# Patient Record
Sex: Male | Born: 1935 | Race: White | Hispanic: No
Health system: Southern US, Community
[De-identification: ages and names within clinical notes are randomized; demographics above are authoritative.]

---

## 2005-10-11 ENCOUNTER — Ambulatory Visit: Payer: Self-pay | Admitting: General Practice

## 2005-10-21 ENCOUNTER — Inpatient Hospital Stay: Payer: Self-pay | Admitting: General Practice

## 2006-03-29 ENCOUNTER — Ambulatory Visit: Payer: Self-pay | Admitting: Ophthalmology

## 2007-06-14 IMAGING — CR DG KNEE 1-2V*L*
1 series · 2 of 2 positions shown · non-contrast
Comparison: none

REASON FOR EXAM: Post-op
COMMENTS:  Bedside (portable):Y

[Series 1: view not recorded · 0.17mm/px · 2 of 2 slices shown]
[im 1/2]
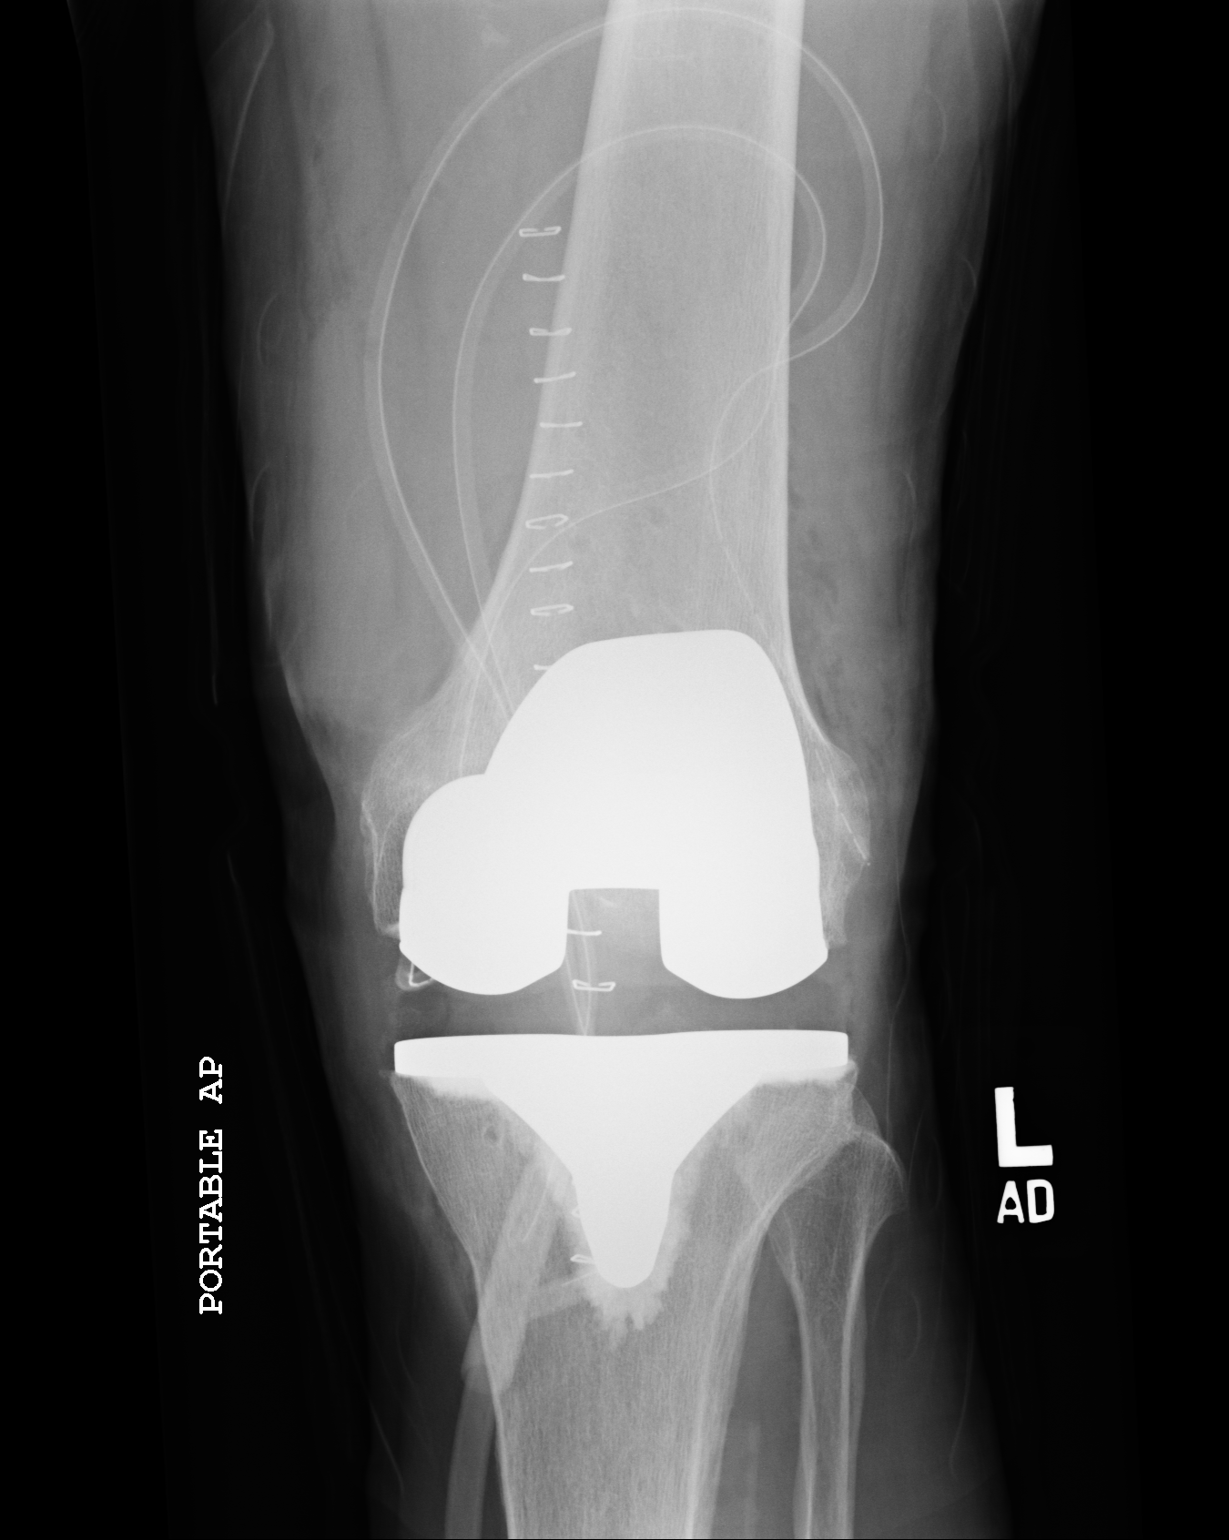
[im 2/2]
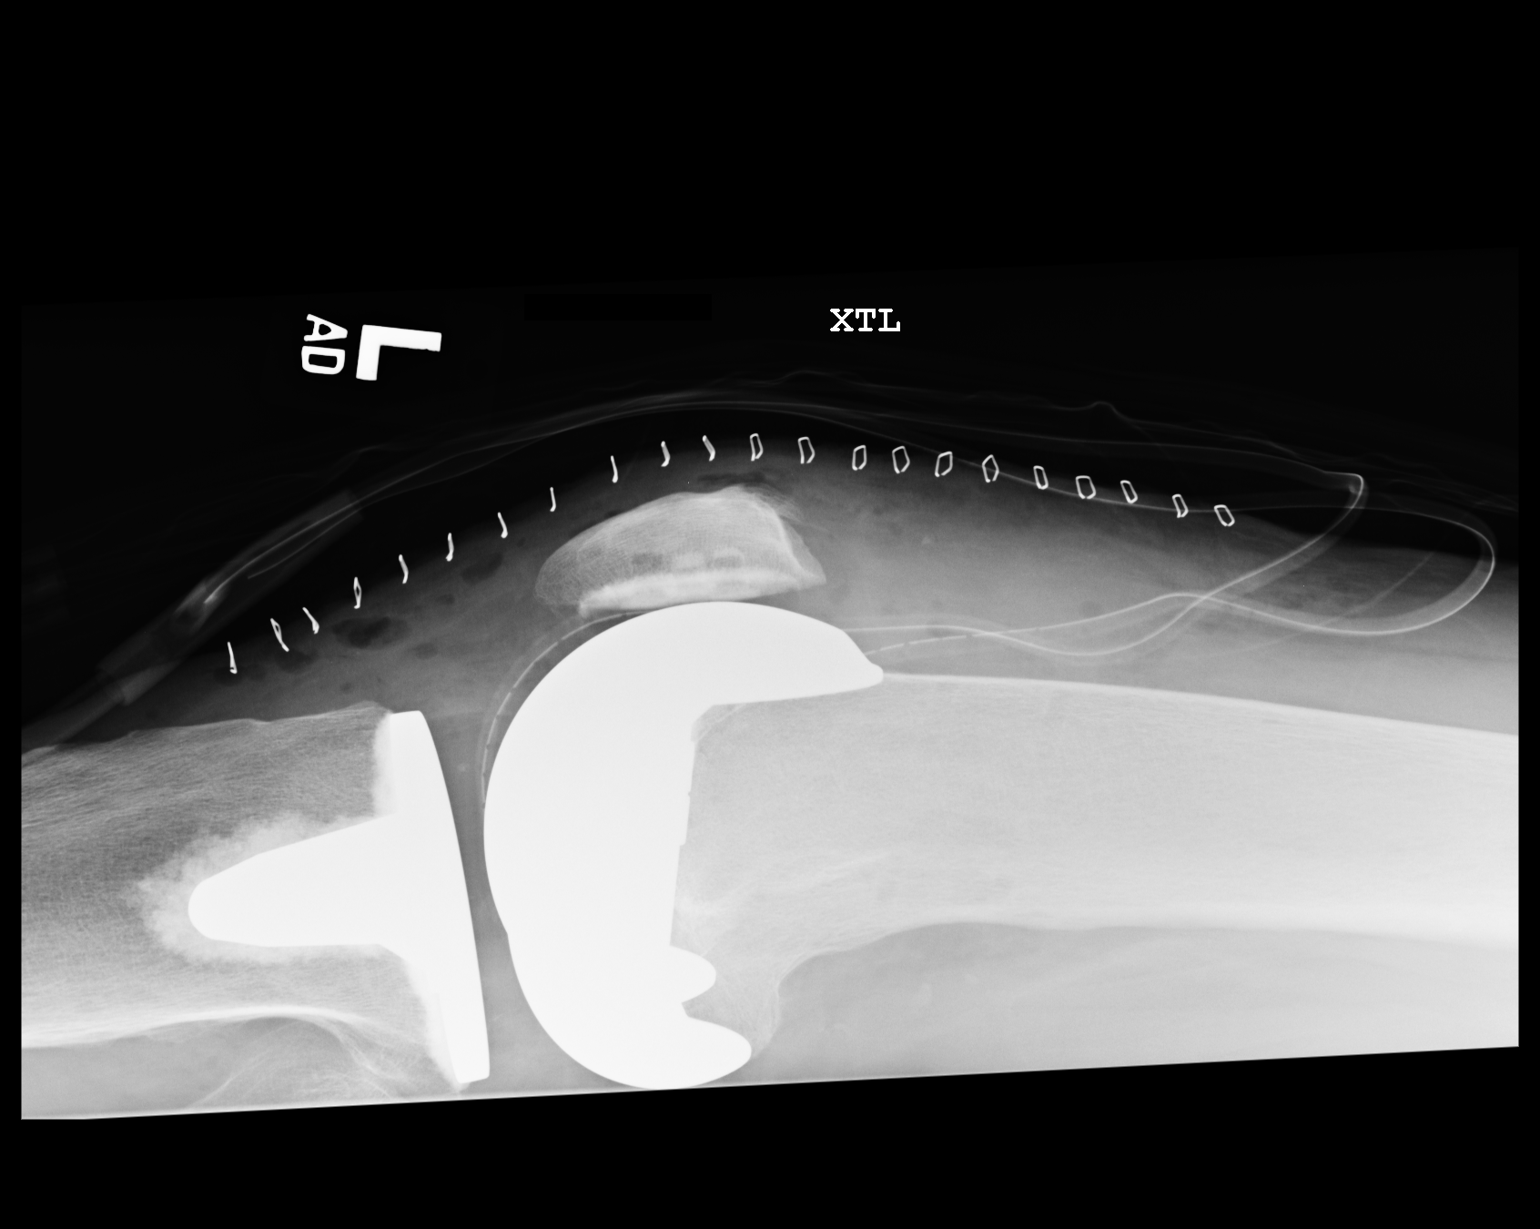

[2 of 2 positions shown; findings below may reference images not displayed]

PROCEDURE:     DXR - DXR KNEE LEFT AP AND LATERAL  - October 21, 2005  [DATE]

RESULT:     AP and lateral views of the LEFT knee show the patient to be
status post LEFT knee replacement. No fracture about the femoral or tibial
prosthetic components is seen. There is no dislocation of the prosthetic
knee joint.
IMPRESSION: The patient is status post LEFT knee replacement. No
abnormal post-operative changes are identified.

## 2012-02-09 ENCOUNTER — Ambulatory Visit: Payer: Self-pay | Admitting: Ophthalmology

## 2013-03-11 ENCOUNTER — Emergency Department: Payer: Self-pay | Admitting: Emergency Medicine

## 2013-04-19 ENCOUNTER — Inpatient Hospital Stay: Payer: Self-pay | Admitting: Family Medicine

## 2013-04-19 LAB — URINALYSIS, COMPLETE
Bilirubin,UR: NEGATIVE
Ketone: NEGATIVE
Nitrite: NEGATIVE
Ph: 5 (ref 4.5–8.0)
Protein: 30
RBC,UR: 5 /HPF (ref 0–5)
Specific Gravity: 1.023 (ref 1.003–1.030)

## 2013-04-19 LAB — COMPREHENSIVE METABOLIC PANEL
Anion Gap: 5 — ABNORMAL LOW (ref 7–16)
Chloride: 104 mmol/L (ref 98–107)
Co2: 27 mmol/L (ref 21–32)
Creatinine: 1.01 mg/dL (ref 0.60–1.30)
EGFR (African American): >60
EGFR (Non-African Amer.): >60
Osmolality: 276 (ref 275–301)
Potassium: 3.9 mmol/L (ref 3.5–5.1)
Sodium: 136 mmol/L (ref 136–145)
Total Protein: 6.2 g/dL — ABNORMAL LOW (ref 6.4–8.2)

## 2013-04-19 LAB — CBC
HCT: 38.8 % — ABNORMAL LOW (ref 40.0–52.0)
Platelet: 146 10*3/uL — ABNORMAL LOW (ref 150–440)
RDW: 13.6 % (ref 11.5–14.5)
WBC: 9 10*3/uL (ref 3.8–10.6)

## 2013-04-19 LAB — PROTIME-INR: Prothrombin Time: 14.9 secs — ABNORMAL HIGH (ref 11.5–14.7)

## 2013-04-19 LAB — LIPASE, BLOOD: Lipase: 98 U/L (ref 73–393)

## 2013-04-19 LAB — CALCIUM: Calcium, Total: 8.6 mg/dL (ref 8.5–10.1)

## 2013-04-21 LAB — BASIC METABOLIC PANEL
BUN: 15 mg/dL (ref 7–18)
Calcium, Total: 8.6 mg/dL (ref 8.5–10.1)
Co2: 27 mmol/L (ref 21–32)
Creatinine: 0.97 mg/dL (ref 0.60–1.30)
EGFR (African American): >60
EGFR (Non-African Amer.): >60
Glucose: 116 mg/dL — ABNORMAL HIGH (ref 65–99)
Osmolality: 272 (ref 275–301)
Sodium: 135 mmol/L — ABNORMAL LOW (ref 136–145)

## 2013-04-23 LAB — PLATELET COUNT: Platelet: 140 10*3/uL — ABNORMAL LOW (ref 150–440)

## 2013-04-25 ENCOUNTER — Inpatient Hospital Stay: Payer: Self-pay | Admitting: Family Medicine

## 2013-04-25 ENCOUNTER — Ambulatory Visit: Payer: Self-pay | Admitting: Hospice and Palliative Medicine

## 2013-04-25 ENCOUNTER — Ambulatory Visit: Payer: Self-pay | Admitting: Internal Medicine

## 2013-04-25 LAB — URINALYSIS, COMPLETE
Glucose,UR: NEGATIVE mg/dL (ref 0–75)
Hyaline Cast: 3
Ketone: NEGATIVE
Ph: 5 (ref 4.5–8.0)
Protein: 30
RBC,UR: 19 /HPF (ref 0–5)
Squamous Epithelial: NONE SEEN

## 2013-04-25 LAB — CK TOTAL AND CKMB (NOT AT ARMC)
CK, Total: 44 U/L (ref 35–232)
CK, Total: 40 U/L (ref 35–232)

## 2013-04-25 LAB — TROPONIN I
Troponin-I: 0.04 ng/mL
Troponin-I: 0.06 ng/mL — ABNORMAL HIGH

## 2013-04-25 LAB — COMPREHENSIVE METABOLIC PANEL
Alkaline Phosphatase: 67 U/L (ref 50–136)
Anion Gap: 3 — ABNORMAL LOW (ref 7–16)
BUN: 34 mg/dL — ABNORMAL HIGH (ref 7–18)
Bilirubin,Total: 0.7 mg/dL (ref 0.2–1.0)
Calcium, Total: 8.9 mg/dL (ref 8.5–10.1)
Creatinine: 1.36 mg/dL — ABNORMAL HIGH (ref 0.60–1.30)
EGFR (African American): 52 — ABNORMAL LOW
Osmolality: 287 (ref 275–301)
Potassium: 4.1 mmol/L (ref 3.5–5.1)
SGOT(AST): 27 U/L (ref 15–37)
SGPT (ALT): 33 U/L (ref 12–78)

## 2013-04-25 LAB — CBC
HCT: 36.5 % — ABNORMAL LOW (ref 40.0–52.0)
MCH: 31.4 pg (ref 26.0–34.0)
MCV: 91 fL (ref 80–100)
RBC: 4.02 10*6/uL — ABNORMAL LOW (ref 4.40–5.90)
RDW: 13.6 % (ref 11.5–14.5)
WBC: 10.9 10*3/uL — ABNORMAL HIGH (ref 3.8–10.6)

## 2013-04-26 LAB — COMPREHENSIVE METABOLIC PANEL
Albumin: 2.2 g/dL — ABNORMAL LOW (ref 3.4–5.0)
Anion Gap: 5 — ABNORMAL LOW (ref 7–16)
BUN: 24 mg/dL — ABNORMAL HIGH (ref 7–18)
Calcium, Total: 8.4 mg/dL — ABNORMAL LOW (ref 8.5–10.1)
Co2: 28 mmol/L (ref 21–32)
EGFR (African American): >60
EGFR (Non-African Amer.): >60
Osmolality: 283 (ref 275–301)
Potassium: 3.9 mmol/L (ref 3.5–5.1)
SGOT(AST): 17 U/L (ref 15–37)
SGPT (ALT): 27 U/L (ref 12–78)
Sodium: 138 mmol/L (ref 136–145)

## 2013-04-26 LAB — CBC WITH DIFFERENTIAL/PLATELET
Basophil #: 0 10*3/uL (ref 0.0–0.1)
HGB: 11.8 g/dL — ABNORMAL LOW (ref 13.0–18.0)
Lymphocyte %: 1.6 %
MCV: 92 fL (ref 80–100)
Monocyte #: 1.2 x10 3/mm — ABNORMAL HIGH (ref 0.2–1.0)
Monocyte %: 8.1 %
Neutrophil %: 89.9 %
Platelet: 142 10*3/uL — ABNORMAL LOW (ref 150–440)
RBC: 3.88 10*6/uL — ABNORMAL LOW (ref 4.40–5.90)
RDW: 13.7 % (ref 11.5–14.5)
WBC: 14.8 10*3/uL — ABNORMAL HIGH (ref 3.8–10.6)

## 2013-04-27 LAB — BASIC METABOLIC PANEL
BUN: 29 mg/dL — ABNORMAL HIGH (ref 7–18)
Calcium, Total: 8.5 mg/dL (ref 8.5–10.1)
Co2: 30 mmol/L (ref 21–32)
Osmolality: 286 (ref 275–301)

## 2013-04-27 LAB — CBC WITH DIFFERENTIAL/PLATELET
Basophil #: 0 10*3/uL (ref 0.0–0.1)
Basophil %: 0.2 %
Eosinophil %: 0 %
HGB: 11.9 g/dL — ABNORMAL LOW (ref 13.0–18.0)
Lymphocyte #: 0.4 10*3/uL — ABNORMAL LOW (ref 1.0–3.6)
MCH: 30.8 pg (ref 26.0–34.0)
MCV: 92 fL (ref 80–100)
Monocyte #: 1.4 x10 3/mm — ABNORMAL HIGH (ref 0.2–1.0)
Neutrophil #: 13.3 10*3/uL — ABNORMAL HIGH (ref 1.4–6.5)
Neutrophil %: 87.6 %
RDW: 14 % (ref 11.5–14.5)
WBC: 15.1 10*3/uL — ABNORMAL HIGH (ref 3.8–10.6)

## 2013-04-27 LAB — MAGNESIUM: Magnesium: 2 mg/dL

## 2013-05-14 ENCOUNTER — Ambulatory Visit: Payer: Self-pay | Admitting: Hospice and Palliative Medicine

## 2013-05-14 DEATH — deceased

## 2014-10-04 NOTE — H&P (Signed)
PATIENT NAME:  Dylan Myers, Dylan Myers MR#:  [id number] DATE OF BIRTH:  Aug 18, 1919  DATE OF ADMISSION:  04/25/2013  PRIMARY CARE PHYSICIAN: Duanne Limerickeanna C. Jones, MD  The patient is a 81 year old Caucasian male with past medical history significant for history of admission for recurrent falls, rhabdomyolysis, and left 7th to 10th rib fractures from the 6th until the 10th of November 2014. He was discharged just 2 days ago to skilled nursing facility, to [facility]. On Monday, the same day when he was discharged, the patient was having some difficulty swallowing, discomfort in  his throat and coughing. The patient's son, however, was able to feed him. The patient was more short of breath as well as poorly responsive the next day, on the 11th of November 2014, and was not able to eat. Now he was brought to the Emergency Room for further evaluation. He was very short of breath and he is on nonrebreather now. He is also noted to have atrial fibrillation with a rate of 160s to 170s, as high as 220s. He is also febrile to temperatures of 101 here in the Emergency Room. He was noted to have some abdominal distention and was not able to void. Foley catheter was placed and more than 1 liter of urine was drained. Hospitalist services were contacted for admission.   PAST MEDICAL HISTORY: Significant for history of recurrent falls as well as rhabdomyolysis  and left 7th to 10th rib fractures, November 6th to 10th admission to [facility], history of dementia, ascending aortic aneurysm of 4.3 cm, history of A. fib, not on anticoagulation, history of hyponatremia, hypomagnesemia during the same admission, history of stroke in the past, also history of hernia, status post hernia repair, history of arthritis, cholecystectomy, appendectomy, left total knee replacement.   ALLERGIES: No known drug allergies.   MEDICATIONS: According to medical records, the patient is on Tylenol 325 mg 2 tablets  rectally every 4 hours as needed as well as p.o., aspirin 81 mg p.o. daily, bisacodyl 10 mg p.o. daily as needed, Cartia 240 mg p.o. daily, Colace 1 mg p.o. twice daily, heparin 5000 units twice daily, lisinopril 2.5 mg p.o. daily, MiraLax 17 grams once daily, ocular lubricant 1 drop to each affected eye every 8 hours as needed, tramadol 50 mg p.o. every 8 hours as needed, Zoloft 25 mg p.o. at bedtime.   SOCIAL HISTORY: Used to live at home by himself. Now he is in rehab since 2 days ago. The patient has a son as well as daughter who take care of him. No smoking. No alcohol abuse.   FAMILY HISTORY: Positive for hypertension.  REVIEW OF SYSTEMS: Not available, as the patient is poorly responsive.   PHYSICAL EXAMINATION:  VITAL SIGNS: On arrival to the hospital, temperature was 101. Respiration rate was 24, pulse 166, blood pressure 129/90. O2  saturation was 97% on 100% nonrebreather.  GENERAL: This is a well-developed, well-nourished Caucasian male in moderate to severe distress. He is short of breath. He is tachypneic.  HEENT: His pupils are equal, reactive to light. Extraocular muscles intact. No icterus or conjunctivitis. I am having difficulty evaluating his hearing, as the patient is poorly responsive. No pharyngeal erythema. Mucosa is dry. The patient is breathing with open mouth breathing.  NECK: No masses. Supple, nontender. Thyroid is not enlarged. No adenopathy. No JVD or carotid bruits bilaterally. Full range of motion.  LUNGS: Clear to auscultation anteriorly. A few rales as well as rhonchi were heard, especially  in lower parts of lungs, as well as diminished breath sounds. The patient has labored inspirations as well as increased effort to breathe. He is moderate to severe respiratory distress.  CARDIOVASCULAR: S1, S2 appreciated. No murmurs, gallops or rubs noted. Rhythm was irregularly irregular, tachycardic. PMI not lateralized. Chest is nontender to palpation.  EXTREMITIES: 1+ pedal  pulses. No lower extremity edema, calf tenderness or cyanosis was noted.  ABDOMEN: Soft, nontender. Bowel sounds were present. No hepatosplenomegaly or masses were noted.  RECTAL: Deferred.  MUSCULOSKELETAL: Muscle strength: Not able to assess, as patient is not cooperative. No cyanosis. Not able to assess him for kyphosis. Gait not tested.  SKIN: Did not reveal any rashes, lesions, erythema, nodularity or induration. It was warm and dry to palpation.  LYMPH: No adenopathy in the cervical region.  NEUROLOGICAL: Cranial nerves grossly intact. Not able to assess sensory or dysarthria. The patient is alert, opens his eyes and moves his eyes around but otherwise not cooperating, otherwise not responding to stimuli and not following commands.  LABORATORY DATA:  BMP showed elevation of glucose to 170. BUN and creatinine were 34 and 1.36, as opposed to 15 and 0.96 on the 8th of November 2014. The patient's liver enzymes were remarkable for albumin level of 2.5. Troponin was 0.04. White blood cell count was elevated to 10.9; hemoglobin was 12.6, platelet count 142. Urinalysis: Yellow hazy urine, negative for glucose, bilirubin or ketones, specific gravity 1.015; pH was 5.0, 2+ blood, 30 mg/dL protein, negative for nitrites, trace leukocyte esterase; 19 red blood cells, 14 white blood cells, no bacteria, no epithelial cells were noted; mucus was present as well as 3 hyaline casts as well as amorphous crystals.   EKG showed A. fib at a rate of 156 beats per minute, left axis deviation, left anterior fascicular block. Nonspecific ST-T changes were noted   RADIOLOGIC STUDIES: Chest x-ray portable single view, 12th of November 2014, revealed  new bibasilar densities consistent with pneumonia, which have appeared since prior study. When patient can tolerate procedure, PA and lateral x-rays would be of value according to radiologist.   ASSESSMENT AND PLAN: 1.  Acute respiratory failure: Admit patient to medical  floor. Get ABGs stat. Will continue oxygen therapy at high rate, keeping pulse oximetries around 92% and above if possible. Will get palliative care involved. The patient does have declaration of desire of natural death, however, remains full code at this time.  2.  Pneumonia, suspected aspiration. However, the patient has been in the hospital recently, so  we will initiate the patient on broad-spectrum antibiotic therapy with vancomycin, Zosyn as well as Zithromax.  3.  Atrial fibrillation with rapid ventricular response: Will continue the patient on Cardizem IV drip.  4.  Renal insufficiency: Will continue IV fluids. Will follow the patient's urine output. The patient's renal insufficiency could have been also related to urinary tract infection as well as urinary retention.  5.  Urinary retention: Will continue Foley catheter.  6.  Dysphagia: Will get speech therapist involved.   TIME SPENT: One hour.    ____________________________ Katharina Caper, MD rv:jcm D: 04/25/2013 13:46:05 ET T: 04/25/2013 14:21:42 ET JOB#: [id number]  cc: Katharina Caper, MD, <Dictator> Duanne Limerick, MD Denyce Harr MD ELECTRONICALLY SIGNED 05/23/2013 14:39

## 2014-10-04 NOTE — H&P (Signed)
PATIENT NAME:  Dylan, Myers MR#:  [id number] DATE OF BIRTH:  26-Jan-1920  DATE OF ADMISSION:  04/19/2013  PRIMARY CARE PHYSICIAN: Duanne Limerick, MD  CHIEF COMPLAINT: Recurrent falls.   HISTORY OF PRESENT ILLNESS: Dylan Myers is a 81 year old Caucasian gentleman who is brought into the Emergency Room by EMS after the patient's daughter found him in the bedroom, leaning against the shelves of a chest of drawers after he could not get up due to his significant weakness. The patient lives at home by himself. He has Lifepath and is followed by a CNA who comes and helps during the daytime, and the patient's daughter and son take turns during the nighttime to take care of him. The patient has been having  mechanical falls, despite using a cane at home. He likely had a fall today several hours ago and lay down on the floor against the chest of drawers in the room. He was found to have significant bruises in his left back and left hip area. Chest x-ray showed no acute abnormality. CT of the head shows chronic changes without acute abnormality. The patient was found to have elevated CPK and is being admitted for acute rhabdomyolysis with frequent falls, failure to thrive and nondisplaced rib fractures on the left status post fall.   PAST MEDICAL HISTORY: 1.  Hypertension.  2.  Early dementia.  3.  Hernia.  4.  History of A. fib.  5.  Arthritis.  6.  Prostatectomy.   7.  Hernia repair surgery.  8.  Appendectomy.  9.  Left total knee replacement.   ALLERGIES: No known drug allergies.   MEDICATIONS: 1.  Aspirin 81 mg daily.  2.  Cartia XT 240 p.o. daily.  3.  Lisinopril 2.5 mg p.o. daily.  4.  Zoloft 25 mg p.o. at bedtime.   SOCIAL HISTORY: The patient lives at home by himself. He has the following arrangement as above. His son and daughter take care of him during the nighttime, and daytime he has CNA who visits and spends several hours at home with him. No smoking. No alcohol.   FAMILY  HISTORY: Positive for hypertension.   REVIEW OF SYSTEMS:    CONSTITUTIONAL: No fever. Positive for fatigue, weakness and left back pain.  EYES: No blurred or double vision or glaucoma or cataracts.  EARS, NOSE, THROAT: No tinnitus, ear pain, hearing loss or postnasal drip.  RESPIRATORY: No cough, wheeze, hemoptysis or COPD.  CARDIOVASCULAR: No chest pain, orthopnea, edema, arrhythmia or dyspnea on exertion.  GASTROINTESTINAL: No nausea, vomiting, diarrhea, abdominal pain or hematemesis. Positive for GERD.  GENITOURINARY: No dysuria, hematuria or renal calculus.  ENDOCRINE: No polyuria, nocturia or thyroid problems.  HEMATOLOGIC: No anemia, easy bruising or bleeding.  SKIN: No acne or rash. Positive for bruises over the left back.  MUSCULOSKELETAL: Positive for arthritis and left knee pain.  NEUROLOGIC: No CVA, TIA, anxiety. Positive for dementia. PSYCHIATRIC: No anxiety or depression. No insomnia.   All other systems reviewed and negative.   PHYSICAL EXAM:  GENERAL: The patient is awake, alert, oriented x 3, not in acute distress.  VITAL SIGNS: Afebrile. Pulse is 125, respirations 19. Blood pressure is 118/76. Sats are 96% on 2 liters.  HEENT: Atraumatic, normocephalic. Pupils: PERRLA. EOM intact. Oral mucosa is moist.  NECK: Supple. No JVD. No carotid bruit.  RESPIRATORY: Clear to auscultation bilaterally. No rales, rhonchi, respiratory distress or labored breathing.  CARDIOVASCULAR: Both the heart sounds are normal.  Rate is tachycardic. Rhythm is  regular. No murmur heard. PMI not lateralized. Chest nontender.  EXTREMITIES: Good pedal pulses, good femoral pulses. No lower extremity edema.  ABDOMEN: Soft, benign, nontender. No organomegaly. There is some tenderness present in the left flank and back because of the rib fractures. No organomegaly. Bowel sounds are positive.  NEUROLOGIC: Grossly intact cranial nerves II through XII. No motor or sensory deficit.  PSYCHIATRIC: The patient is  awake, alert, oriented x 3.  SKIN: Warm and dry.   CT of the chest shows nondisplaced left lateral 7th through 10th rib fractures present. No pneumothorax. Central airways are patent. No evidence of aspiration. There is ankylosis of the portions of the thoracic spine with ossification of the disk spaces. This is most compatible with DISH. Ascending aortic aneurysm measuring 43 mm. Coronary atherosclerosis present. No pleural or pericardial effusion.The gallbladder is partially collapsed. No solid or hollow abdominal visceral injury.   CT of the head is chronic changes without acute abnormality.   Calcium is 8.6. CK total is 1893; MB is 13.1. Glucose is 119; BUN is 20; creatinine is 1.01; sodium is 136. SGOT is 85; SGPT is 33; albumin is 3.3. Hemoglobin and hematocrit are 13.6 and 38.8; platelet count is 146; white count is 9.0. Lipase is 98. Magnesium is 1.7. PT-INR is 14.9 and 1.2.   ASSESSMENT AND PLAN: A 81 year old Dylan Myers with history of chronic atrial fibrillation, hypertension, early dementia, comes in after he was found down on the floor leaning against a chest of drawers  in the bedroom for several hours. He was found to have:  1.  Acute rhabdomyolysis. The patient is going to be admitted to medical floor. Will start patient with aggressive hydration, follow his I's and O's, follow up metabolic panel and CPK.  2.  Nondisplaced left 7th to 10th rib fractures status post fall at home. No evidence of pneumothorax. P.r.n. pain meds with tramadol and Tylenol. Incentive spirometry will be given to patient.  3.  Relative hypotension in a patient with history of hypertension. Will give IV fluids, hydrate patient well,  and then resume BP medications when blood pressure is stable.  4.  Early dementia. Continue Zoloft.  5.  Recurrent falls with mechanical falls at home, accidental. Will have PT evaluate patient.  6.  Discharge planning. The patient's son, who is the healthcare power of  attorney, along with the daughter would like to discuss with social work/care management for discharge planning, [facility] with the family at this time.  7.  Deep vein thrombosis prophylaxis: Subcutaneous heparin.  8.  Incidental finding of ascending abdominal aortic aneurysm, which is 43 mm. Will consider curb siding vascular in the morning. Will discuss with patient's son and make him aware about the AAA that was found on the CT of the chest.  9.  Further work-up according to the patient's clinical course. Hospital admission plan was discussed with the patient and the patient's son. Son is the healthcare power of attorney. The patient is a no code/DNR per son. Will respect his wishes and put the orders for DNR.  TIME SPENT: 50 minutes.   ____________________________ Wylie HailSona A. Allena KatzPatel, MD sap:jcm D: 04/19/2013 18:18:04 ET T: 04/19/2013 19:24:20 ET JOB#: [id number]  cc: Syrenity Klepacki A. Allena KatzPatel, MD, <Dictator> Duanne Limerickeanna C. Jones, MD Willow OraSONA A Brandis Matsuura MD ELECTRONICALLY SIGNED 04/20/2013 14:26

## 2014-10-04 NOTE — Discharge Summary (Signed)
PATIENT NAME:  Virl SonLBRIGHT, Jandel F MR#:  [id number] DATE OF BIRTH:  Nov 06, 1919  DATE OF ADMISSION:  04/19/2013 DATE OF DISCHARGE:  04/23/2013  ADMISSION DIAGNOSIS: Recurrent falls.  DISCHARGE DIAGNOSES: 1.  Recurrent falls. 2.  Acute rhabdomyolysis.  3.  Nondisplaced left 7 to 10 rib fractures.  4.  Relative hypotension, resolved with fluids.  5.  Early dementia.  6.  Ascending aortic abdominal aneurysm, 4.3 cm 7.  DO NOT RESUSCITATE.  8.  History of atrial fibrillation, rate controlled. Not on anticoagulation.  9.  Mild hyponatremia. 10.  Mild hypomagnesemia.  11.  Previous cerebrovascular accident.   IMPORTANT RESULTS: Glucose 119, BUN 20, creatinine 1.01. Magnesium 1.7. Total protein 6.2, albumin 3.2. Total CK on admission 1800, down to 400 now. Platelets 146, hemoglobin 13.6, white blood count 9. Urinalysis overall negative for infection.  CT of the head without contrast: Chronic changes without any acute abnormality. Mostly old posterior temporal infarct. No acute masses.  CT of the chest, as mentioned above, 7 to 10 nondisplaced rib fractures and 4.3 cm ascending aortic aneurysm, stable.   DISCHARGE MEDICATIONS: 1.  Lisinopril 2.5 mg daily. 2.  Cartia 240 mg once a day, XL formulation. 3.  Aspirin 81 mg daily. 4.  Zoloft 25 mg daily. 5.  Tylenol 2 tablets every 4 hours p.r.n.  6.  Tramadol 50 mg every 8 hours.  7.  Heparin 500 units subcutaneously every 12 hours.  8.  Ocular lubricant.  9.  MiraLax 17 grams once a day.  10.  Dulcolax suppository as needed for constipation.  11.  Colace twice daily.   DISCHARGE FOLLOWUP: With Dr. Elizabeth Sauereanna Jones in the next 1 to 2 weeks.   HOSPITAL COURSE: A 81 year old gentleman with history of early dementia, comes with recurrent falls, on 04/19/2013, brought by EMS as his daughter found him down in the bathroom leaning against a shelf of a chest of drawers. After he was unable to get up due to significant weakness, the daughter decided to  come to the ER via EMS. He has Lifepath during the daytime, but at nighttime his son and daughter take care of him. The patient had some bruises on the chest for what a CT of the chest was done. The CT of the chest showed significant fracture, 7 to 10, nondisplaced, on the left side of the ribs. He had elevation of CPK, mild rhabdomyolysis, for which the patient was admitted and evaluated.  1.  Rhabdomyolysis. The patient was given IV fluids. There were no signs of precipitation of myoglobin. No acute kidney injury. The patient had mild rhabdomyolysis, resolved with IV fluids.  2.  Rib fractures. No pneumothorax. Nondisplaced, The patient only needs pain medications and incentive spirometry.  3.  Relative hypotension. It was secondary to dehydration and intravascular volume depletion and it resolved with fluids as well. The patient has early dementia for what he has been taking Zoloft. He is [age] and he is in a very good state of health for his age.  4.  As far as hypertension, he will continue lisinopril 2.5 mg once daily. He has a triple A for what he will need a CT of the abdomen every year to evaluate for increase of size. At this moment, since the patient is [age], the family decided not to do this and just monitor clinically. The patient would not be a good candidate for surgery at this moment even if it was more than 5 mm. The patient did well during this  hospitalization.  I spent about 45 minutes discharging this patient today. ____________________________ Felipa Furnace, MD rsg:sb D: 04/23/2013 12:52:41 ET T: 04/23/2013 13:09:39 ET JOB#: [id number]  cc: Felipa Furnace, MD, <Dictator> Duanne Limerick, MD Pearletha Furl MD ELECTRONICALLY SIGNED 05/02/2013 23:01

## 2014-10-04 NOTE — Discharge Summary (Signed)
PATIENT NAME:  Dylan Myers, Dylan Myers MR#:  [id number] DATE OF BIRTH:  1920-04-11  DATE OF ADMISSION:  04/25/2013 DATE OF DISCHARGE:  12-08-12  REASON FOR ADMISSION: Difficulty swallowing,  difficulty breathing and altered mental status.   DISPOSITION:  Hospice home.   MEDICATIONS AT DISCHARGE:  Morphine 20 mg p.o. as needed for pain, atropine drops on the eye every hour for secretions, lorazepam 1 mg tablet as needed for anxiety, oxygen for comfort only.    HOSPITAL COURSE:  Dylan Myers is a very nice 81 year old gentleman, who has history of dementia. He was admitted to the hospital previously on November 6 to November 10.  The patient had a significant history of falls that were recurrent, complicated with rhabdomyolysis. One of his falls rib fractures 7 to 10, on left side. The patient was discharged to [facility] and apparently was having some difficulty swallowing and some coughing. The family was able to feed him, but he started becoming poorly responsive and short of breath, the following day. He was not able to eat, for what he was brought to the Emergency Department. In the Emergency Department, he was found to be on atrial fibrillation with rapid ventricular response up to 220. He had a temperature of 101F  and the patient has bilateral consolidations on both lower lungs and he had some urinary retention. After the Foley was placed, the patient drained over 1 liter of urine. The patient was  tachypneic, tachycardic with a high temperature and he was on 100% nonrebreather at 97%. There is no documented oxygen saturation without the nonrebreather. The patient looked severely ill, septic looking and with acute respiratory failure. His CO2 went up to 89% and that was with supplemental oxygen. He was in severe respiratory distress with use of accessory muscles.  The patient was very ill and he was admitted through the Emergency Department.  He was already a DO NOT RESUSCITATE and DO  NOT INTUBATE, but he was put on a Cardizem drip to reverse his atrial fibrillation with RVR.  As far as the diagnosis, the patient was diagnosed with pneumonia for which he was started on Zosyn and Zithromax and Levaquin was started the following day instead of Zithromax to cover for  health care acquired pneumonia. He was also started on vancomycin. He was not really responding very well to treatment. He was severely obtunded,  unresponsive for what the family had made the decision of sending him to hospice care and withdraw any treatments.  His atrial fibrillation responded partially the Cardizem drip, but this was pretty much triggered by sepsis. The patient was tachycardic, tachypneic with systemic inflammatory response syndrome secondary to pneumonia. For his urinary retention,  the patient was left on the Foley catheter and discharged with the Foley catheter. Other medical problems were stable. The patient had is wishes made up in the past and the family was well aware of them. Long discussions with the family were done during this hospitalization. I spent about 95 minutes with this patient and his family on the discharge day, 12-08-12.   ____________________________ Felipa Furnaceoberto Sanchez Gutierrez, MD rsg:cc D: 04/30/2013 22:22:57 ET T: 04/30/2013 23:18:28 ET JOB#: [id number]  cc: Felipa Furnaceoberto Sanchez Gutierrez, MD, <Dictator> Zorianna Taliaferro Juanda ChanceSANCHEZ GUTIERRE MD ELECTRONICALLY SIGNED 05/02/2013 23:12

## 2014-12-11 IMAGING — CT CT HEAD WITHOUT CONTRAST
2 series · 14 of 30 positions shown, 16 images · non-contrast
Comparison: 03/11/2013

CLINICAL DATA: Unresponsive

EXAM:
CT HEAD WITHOUT CONTRAST
TECHNIQUE: Contiguous axial images were obtained from the base of the skull
through the vertex without intravenous contrast.

[Series 2: head wo · axial · 0.43mm/px · z∈[+1262,+1367]mm · 6 of 31 slices shown, 8 images]
[im 5/31  brain]
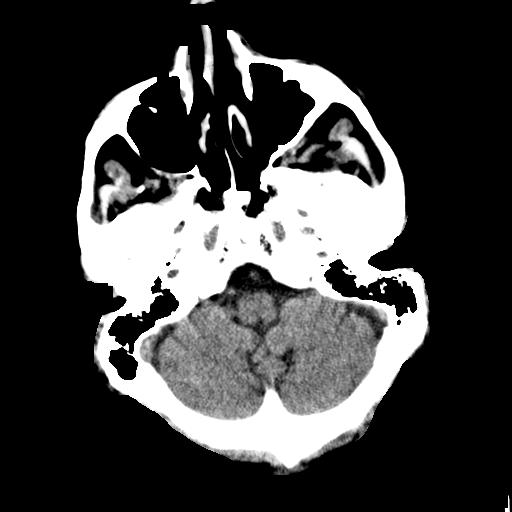
[im 5/31  bone]
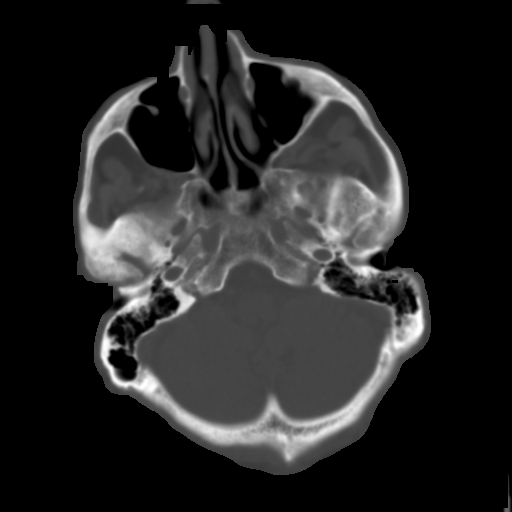
[im 9/31  brain]
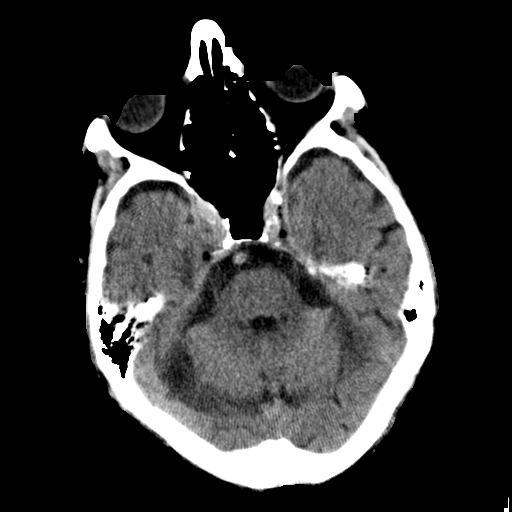
[im 13/31  brain]
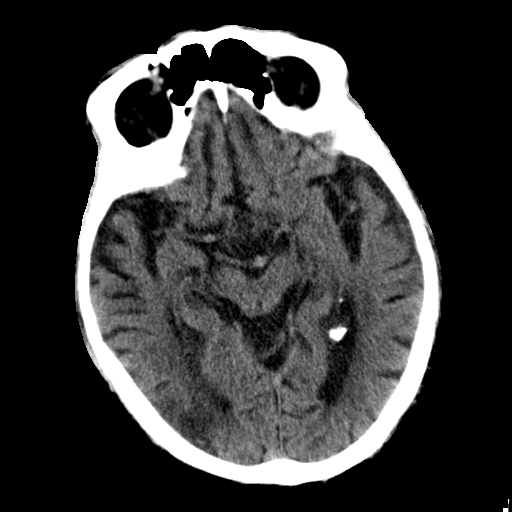
[im 18/31  brain]
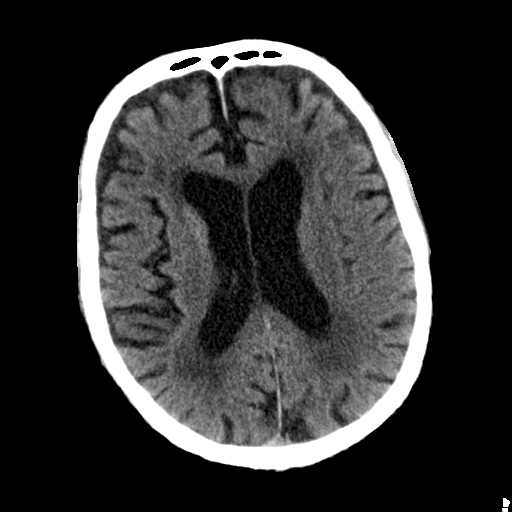
[im 22/31  brain]
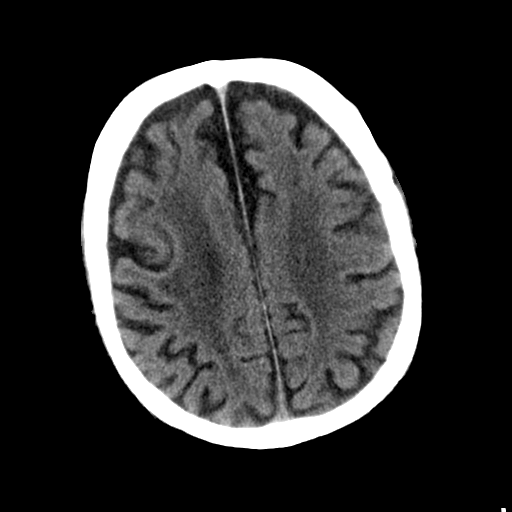
[im 22/31  bone]
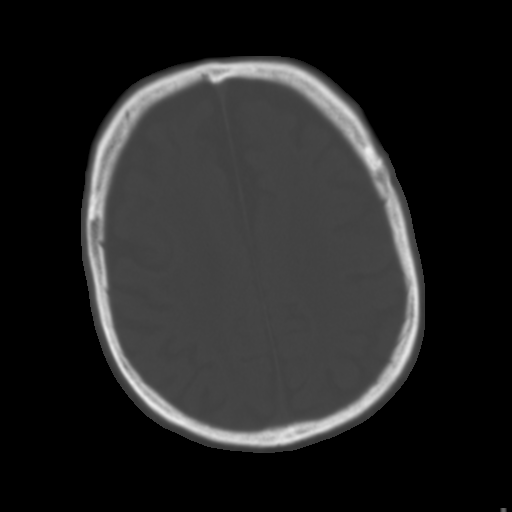
[im 26/31  brain]
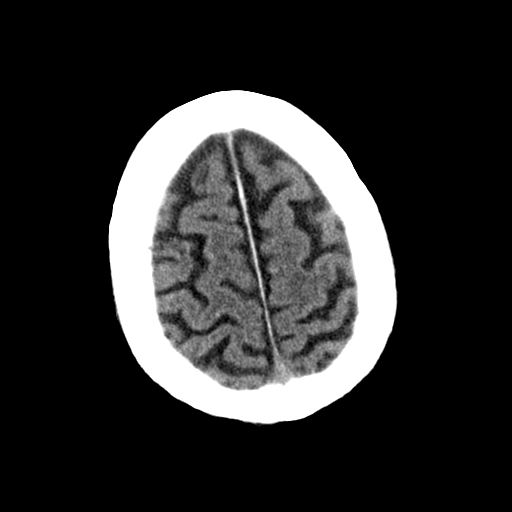

[Series 3: head bone · axial · 0.43mm/px · z∈[+1250,+1384]mm · 8 of 83 slices shown]
[im 8/83  bone]
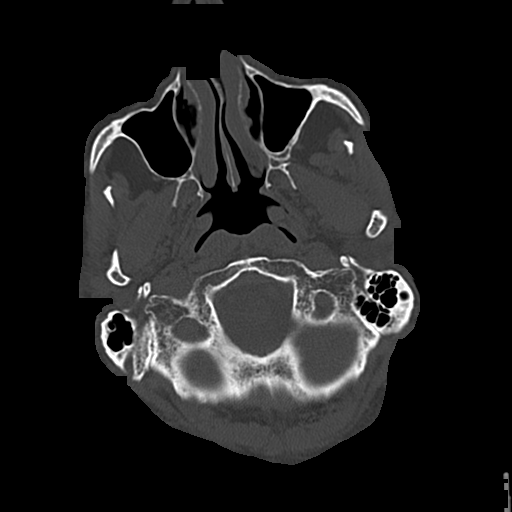
[im 16/83  bone]
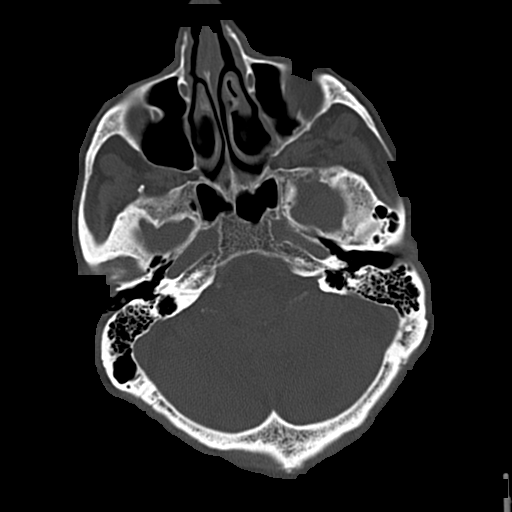
[im 28/83  bone]
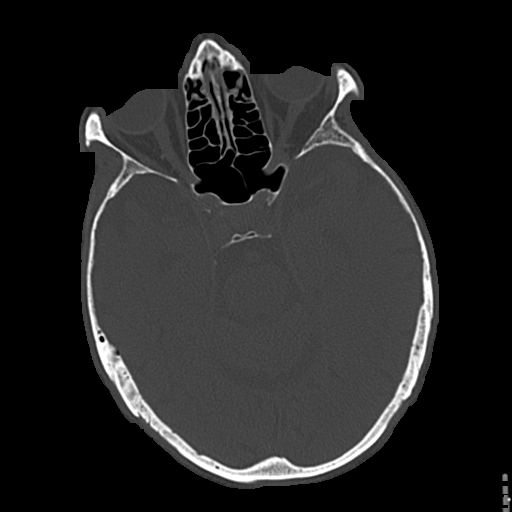
[im 36/83  bone]
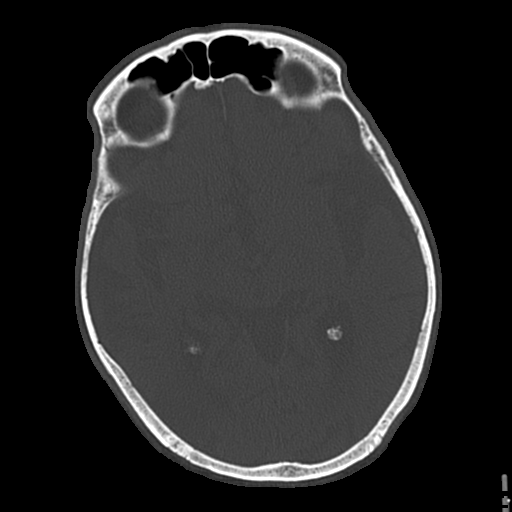
[im 47/83  bone]
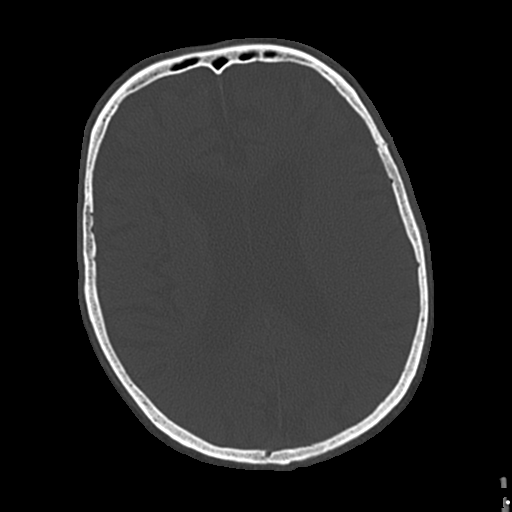
[im 55/83  bone]
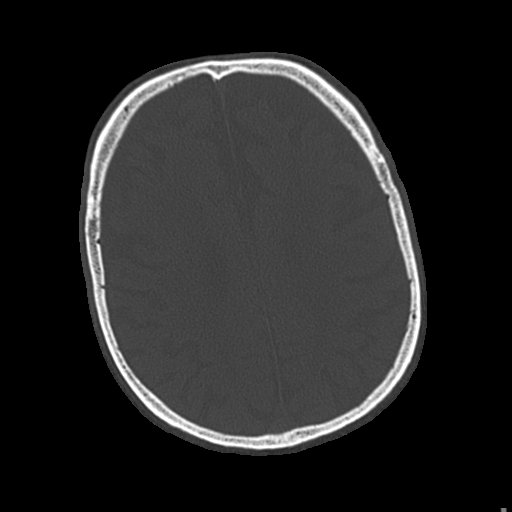
[im 67/83  bone]
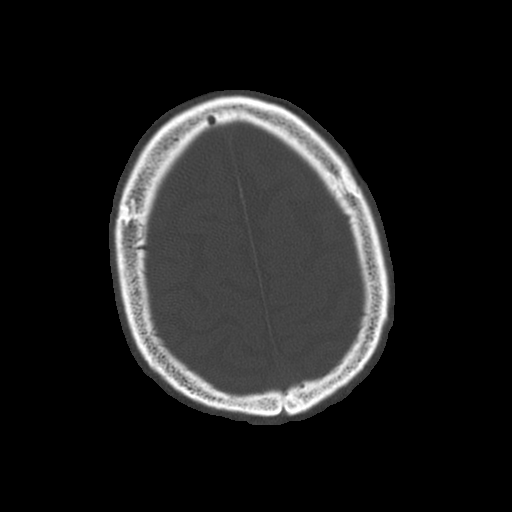
[im 75/83  bone]
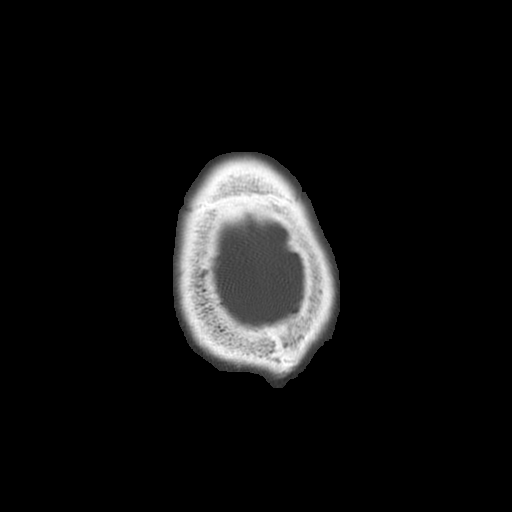

[14 of 30 positions shown; findings below may reference images not displayed]

FINDINGS: The bony calvarium is intact. Atrophic changes and chronic white
matter ischemic change is again identified similar to that noted on
the prior exam. An old posterior temporal infarct is noted on the
right. No findings to suggest acute hemorrhage, acute infarction or
space-occupying mass lesion are noted.
IMPRESSION: Chronic changes without acute abnormality.

## 2014-12-17 IMAGING — CR DG CHEST 1V PORT
1 series · 1 of 1 positions shown · non-contrast
Comparison: April 19, 2013.

CLINICAL DATA: Dyspnea

EXAM:
PORTABLE CHEST - 1 VIEW

[ap]
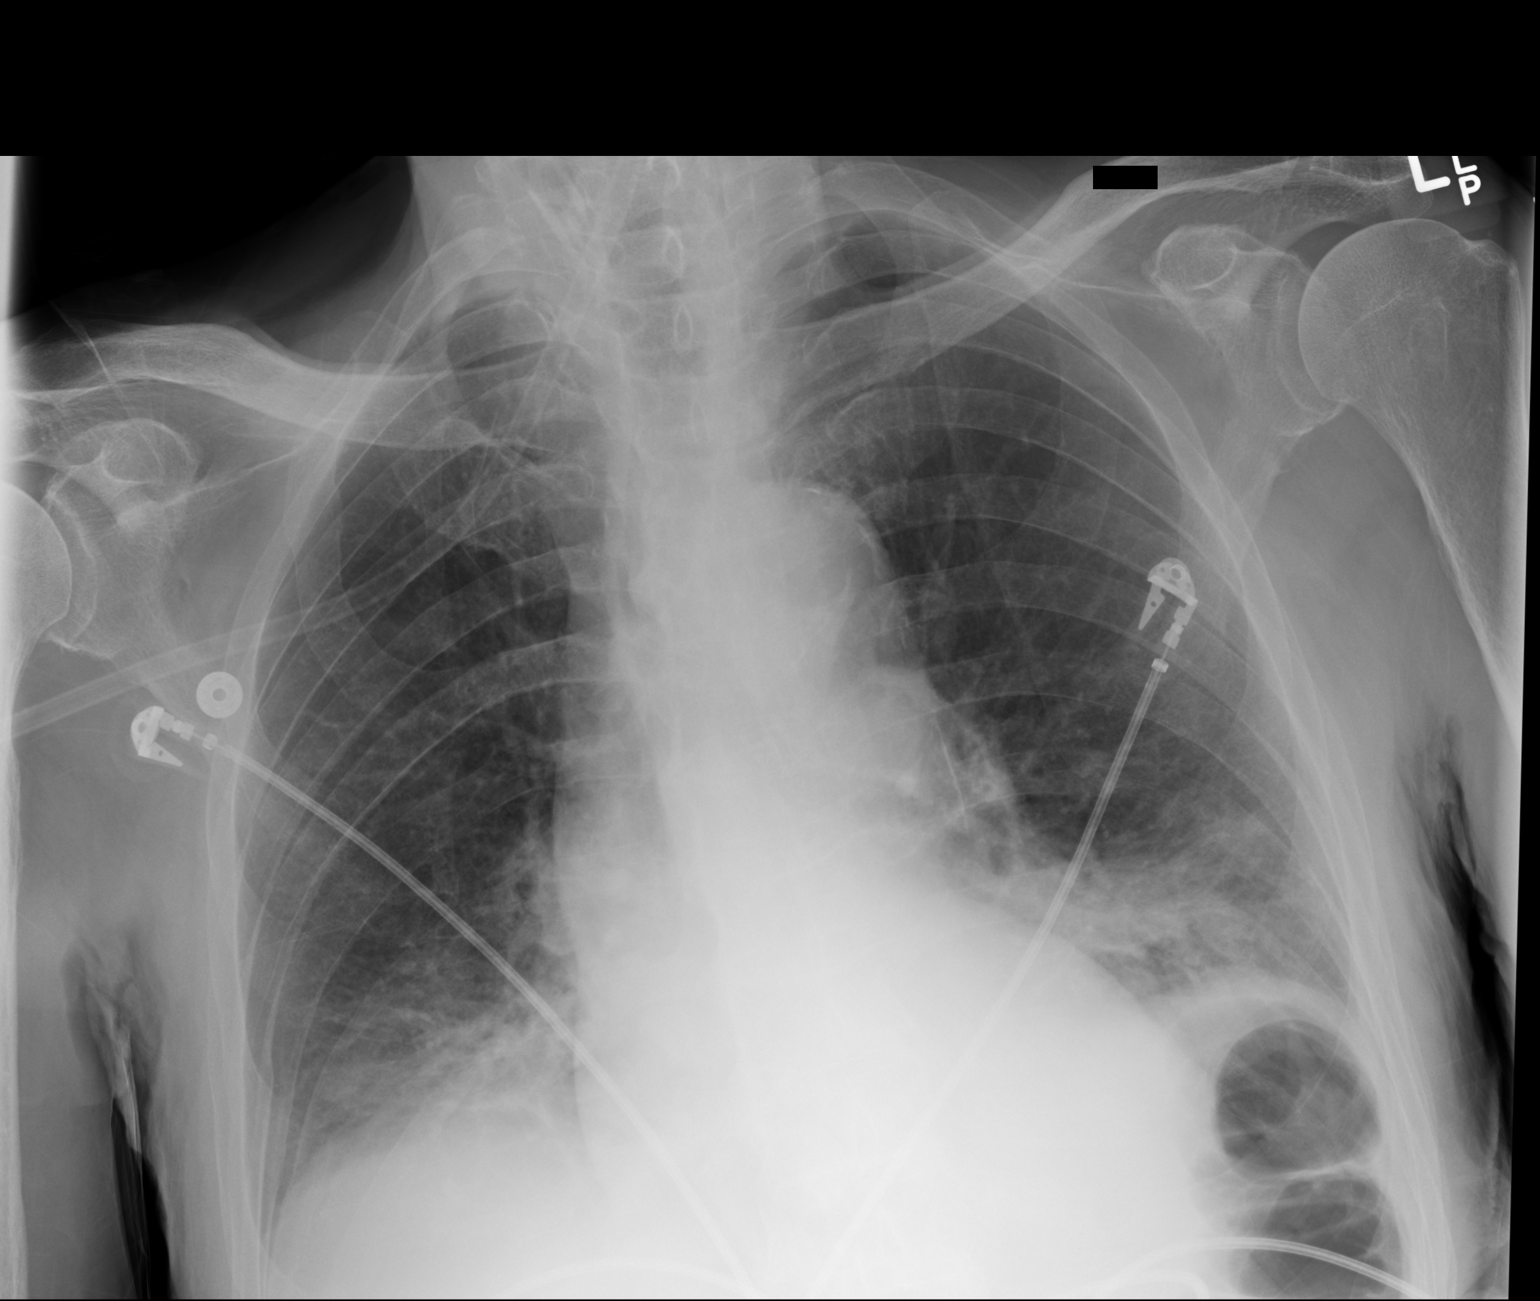

[1 of 1 positions shown; findings below may reference images not displayed]

FINDINGS: Since the previous study the patient has developed bibasilar
infiltrates. There is partial obscuration of the right
hemidiaphragm. The cardiac silhouette is normal in size. The
pulmonary vascularity is not engorged. No pleural effusion is
demonstrated. The mediastinum is normal in width. The observed
portions of the bony thorax exhibit no acute abnormalities.
IMPRESSION: New bibasilar densities are consistent with pneumonia which have
appeared since the previous study. When the patient can tolerate the
procedure, a PA and lateral chest x-ray would be of value.
